# Patient Record
Sex: Female | Born: 1955 | Race: White | Hispanic: No | Marital: Married | Smoking: Never smoker
Health system: Southern US, Community
[De-identification: ages and names within clinical notes are randomized; demographics above are authoritative.]

## PROBLEM LIST (undated history)

## (undated) DIAGNOSIS — E119 Type 2 diabetes mellitus without complications: Secondary | ICD-10-CM

---

## 1994-05-11 HISTORY — PX: ABDOMINAL HYSTERECTOMY: SHX81

## 2017-11-14 ENCOUNTER — Other Ambulatory Visit: Payer: Self-pay

## 2017-11-14 ENCOUNTER — Ambulatory Visit
Admission: EM | Admit: 2017-11-14 | Discharge: 2017-11-14 | Disposition: A | Discharge status: Home / Self Care | Payer: PRIVATE HEALTH INSURANCE | Attending: Family Medicine | Admitting: Family Medicine

## 2017-11-14 DIAGNOSIS — B356 Tinea cruris: Secondary | ICD-10-CM

## 2017-11-14 DIAGNOSIS — B354 Tinea corporis: Secondary | ICD-10-CM

## 2017-11-14 HISTORY — DX: Type 2 diabetes mellitus without complications: E11.9

## 2017-11-14 MED ORDER — FLUCONAZOLE 150 MG PO TABS: ORAL_TABLET | ORAL | 0 refills | Status: AC

## 2017-11-14 NOTE — Discharge Instructions (Signed)
Hold lovastatin while taking antifungal

## 2017-11-14 NOTE — ED Provider Notes (Signed)
[facility]    CSN: [id number] Arrival date & time: 11/14/17  1416     History   Chief Complaint Chief Complaint  Patient presents with  . Rash    HPI Nancy Klein is a 62 y.o. female.   62 yo   The history is provided by the patient.  Rash  Location:  Pelvis Pelvic rash location:  Vulva, R buttock, L buttock and perineum Quality: burning, itchiness, painful, peeling, redness and scaling   Severity:  Moderate Onset quality:  Sudden Duration:  3 days Timing:  Constant Progression:  Worsening Chronicity:  New Context: not animal contact, not chemical exposure, not diapers, not eggs, not exposure to similar rash, not food, not hot tub use, not insect bite/sting, not medications, not new detergent/soap, not nuts, not plant contact, not pollen, not pregnancy, not sick contacts and not sun exposure   Relieved by:  Nothing Ineffective treatments:  Anti-itch cream Associated symptoms: no abdominal pain, no diarrhea, no fatigue, no fever, no headaches, no hoarse voice, no induration, no joint pain, no myalgias, no nausea, no periorbital edema, no shortness of breath, no sore throat, no throat swelling, no tongue swelling, no URI, not vomiting and not wheezing     Past Medical History:  Diagnosis Date  . Diabetes (HCC)     There are no active problems to display for this patient.   Past Surgical History:  Procedure Laterality Date  . ABDOMINAL HYSTERECTOMY  1996    OB History   None      Home Medications    Prior to Admission medications   Medication Sig Start Date End Date Taking? Authorizing Provider  glipiZIDE (GLUCOTROL XL) 5 MG 24 hr tablet Take by mouth. 10/13/17 10/13/18 Yes [provider]  Insulin Glargine (LANTUS SOLOSTAR) 100 UNIT/ML Solostar Pen Inject into the skin. 09/10/17  Yes [provider]  losartan (COZAAR) 25 MG tablet TK 1 T PO ONCE D 10/13/17  Yes [provider]  lovastatin (MEVACOR) 40 MG tablet TK 1 T PO QD  05/24/17  Yes [provider]  metFORMIN (GLUCOPHAGE-XR) 500 MG 24 hr tablet Take by mouth. 09/10/17 09/10/18 Yes [provider]  topiramate (TOPAMAX) 25 MG tablet TK 1 T PO D 08/18/17  Yes [provider]  venlafaxine XR (EFFEXOR-XR) 37.5 MG 24 hr capsule TK ONE C PO ONCE D 09/10/17  Yes [provider]  zolpidem (AMBIEN) 10 MG tablet Take by mouth. 07/27/17  Yes [provider]  fluconazole (DIFLUCAN) 150 MG tablet 1 tab po once weekly 11/14/17   Payton Mccallumonty, Ruchama Kubicek, MD    Family History Family History  Problem Relation Age of Onset  . Diabetes Mother     Social History Social History   Tobacco Use  . Smoking status: Never Smoker  . Smokeless tobacco: Never Used  Substance Use Topics  . Alcohol use: Yes    Comment: rarely  . Drug use: Never     Allergies   Amoxicillin   Review of Systems Review of Systems  Constitutional: Negative for fatigue and fever.  HENT: Negative for hoarse voice and sore throat.   Respiratory: Negative for shortness of breath and wheezing.   Gastrointestinal: Negative for abdominal pain, diarrhea, nausea and vomiting.  Musculoskeletal: Negative for arthralgias and myalgias.  Skin: Positive for rash.  Neurological: Negative for headaches.     Physical Exam Triage Vital Signs ED Triage Vitals  Enc Vitals Group     BP 11/14/17 1432 110/67  Pulse Rate 11/14/17 1432 94     Resp 11/14/17 1432 17     Temp 11/14/17 1432 98.6 F (37 C)     Temp Source 11/14/17 1432 Oral     SpO2 11/14/17 1432 97 %     Weight 11/14/17 1428 280 lb (127 kg)     Height 11/14/17 1428 5' 2.5" (1.588 m)     Head Circumference --      Peak Flow --      Pain Score 11/14/17 1428 9     Pain Loc --      Pain Edu? --      Excl. in GC? --    No data found.  Updated Vital Signs BP 110/67 (BP Location: Right Arm)   Pulse 94   Temp 98.6 F (37 C) (Oral)   Resp 17   Ht 5' 2.5" (1.588 m)   Wt 280 lb (127 kg)   SpO2 97%   BMI  50.40 kg/m   Visual Acuity Right Eye Distance:   Left Eye Distance:   Bilateral Distance:    Right Eye Near:   Left Eye Near:    Bilateral Near:     Physical Exam  Constitutional: She appears well-developed and well-nourished. No distress.  Genitourinary: Pelvic exam was performed with patient supine. There is rash on the right labia. There is rash on the left labia.  Genitourinary Comments: Extensive external vulvar erythematous, scaly, excoriated, skin extending towards the inner thighs and buttocks  Skin: She is not diaphoretic.  Nursing note and vitals reviewed.    UC Treatments / Results  Labs (all labs ordered are listed, but only abnormal results are displayed) Labs Reviewed - No data to display  EKG None  Radiology No results found.  Procedures Procedures (including critical care time)  Medications Ordered in UC Medications - No data to display  Initial Impression / Assessment and Plan / UC Course  I have reviewed the triage vital signs and the nursing notes.  Pertinent labs & imaging results that were available during my care of the patient were reviewed by me and considered in my medical decision making (see chart for details).      Final Clinical Impressions(s) / UC Diagnoses   Final diagnoses:  Tinea cruris  Tinea corporis     Discharge Instructions     Hold lovastatin while taking antifungal    ED Prescriptions    Medication Sig Dispense Auth. Provider   fluconazole (DIFLUCAN) 150 MG tablet 1 tab po once weekly 2 tablet Payton Mccallum, MD     1. diagnosis reviewed with patient 2. rx as per orders above; reviewed possible side effects, interactions, risks and benefits  3. Recommend supportive treatment with otc antifungal powder  4. Follow-up prn if symptoms worsen or don't improve Controlled Substance Prescriptions  Controlled Substance Registry consulted? Not Applicable   Payton Mccallum, MD 11/14/17 [phone]

## 2017-11-14 NOTE — ED Triage Notes (Signed)
Patient complains of rash on vaginal area and is on the back left leg. Patient states that rash is very itchy.

## 2017-12-17 ENCOUNTER — Other Ambulatory Visit: Payer: Self-pay | Admitting: Family Medicine

## 2017-12-17 DIAGNOSIS — Z1231 Encounter for screening mammogram for malignant neoplasm of breast: Secondary | ICD-10-CM

## 2017-12-21 ENCOUNTER — Encounter (INDEPENDENT_AMBULATORY_CARE_PROVIDER_SITE_OTHER): Payer: Self-pay

## 2017-12-21 ENCOUNTER — Ambulatory Visit
Admission: RE | Admit: 2017-12-21 | Discharge: 2017-12-21 | Disposition: A | Discharge status: Home / Self Care | Payer: No Typology Code available for payment source | Source: Ambulatory Visit | Attending: Family Medicine | Admitting: Family Medicine

## 2017-12-21 DIAGNOSIS — Z1231 Encounter for screening mammogram for malignant neoplasm of breast: Secondary | ICD-10-CM | POA: Diagnosis present

## 2017-12-28 ENCOUNTER — Inpatient Hospital Stay
Admission: RE | Admit: 2017-12-28 | Discharge: 2017-12-28 | Disposition: A | Discharge status: Home / Self Care | Payer: Self-pay | Source: Ambulatory Visit | Attending: *Deleted | Admitting: *Deleted

## 2017-12-28 ENCOUNTER — Other Ambulatory Visit: Payer: Self-pay | Admitting: *Deleted

## 2017-12-28 DIAGNOSIS — Z9289 Personal history of other medical treatment: Secondary | ICD-10-CM

## 2018-10-20 IMAGING — MG MM DIGITAL SCREENING BILAT W/ TOMO W/ CAD
8 series · 8 of 24 positions shown · non-contrast
Comparison: Previous exam(s).

ACR Breast Density Category a: The breast tissue is almost entirely
fatty.

CLINICAL DATA: Screening.

EXAM:
DIGITAL SCREENING BILATERAL MAMMOGRAM WITH TOMO AND CAD

[R CC synth-2D]
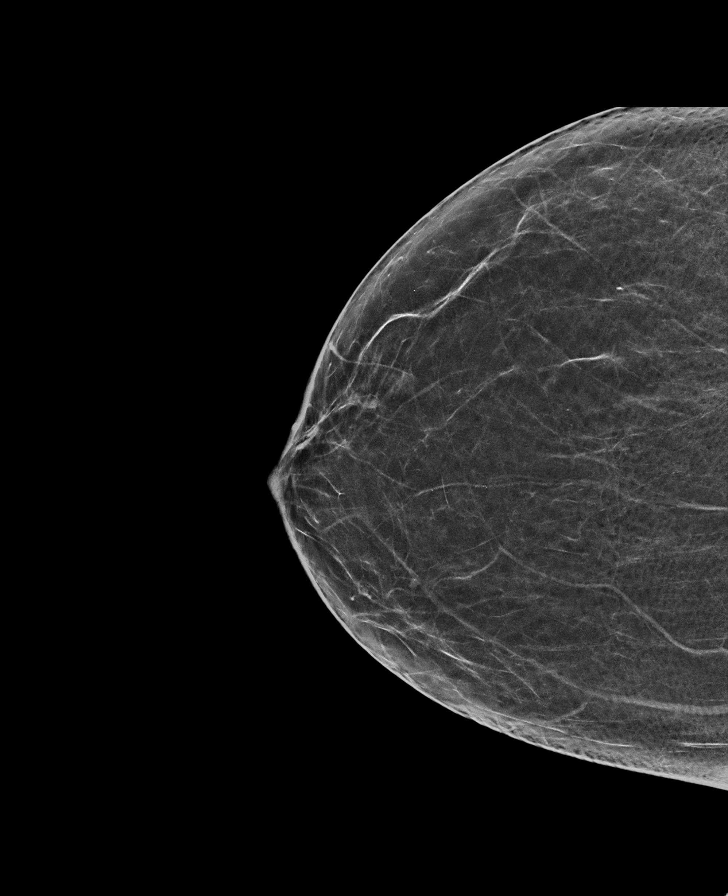

[L MLO synth-2D]
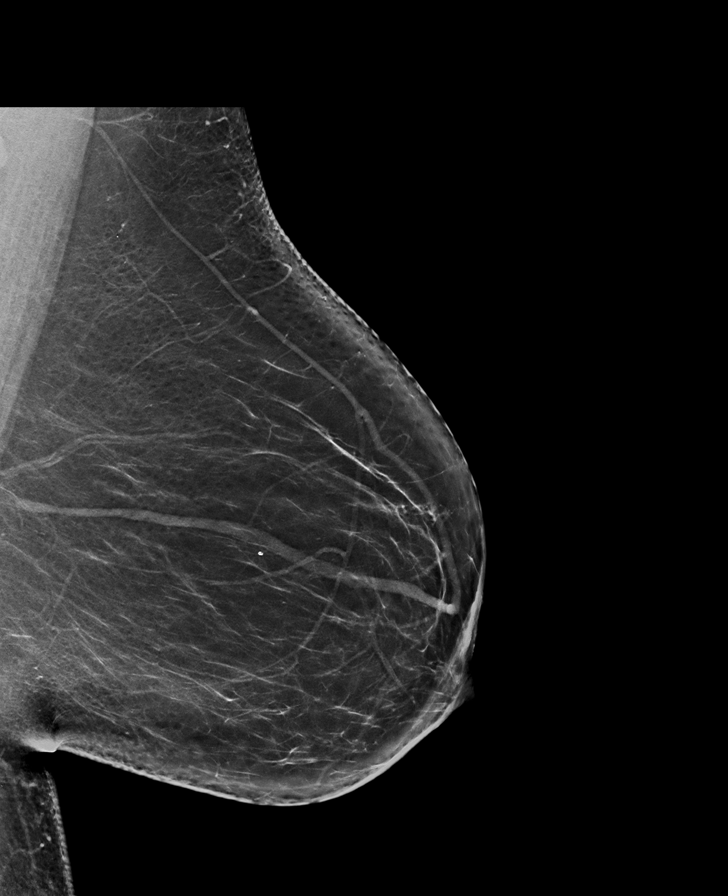

[R MLO synth-2D]
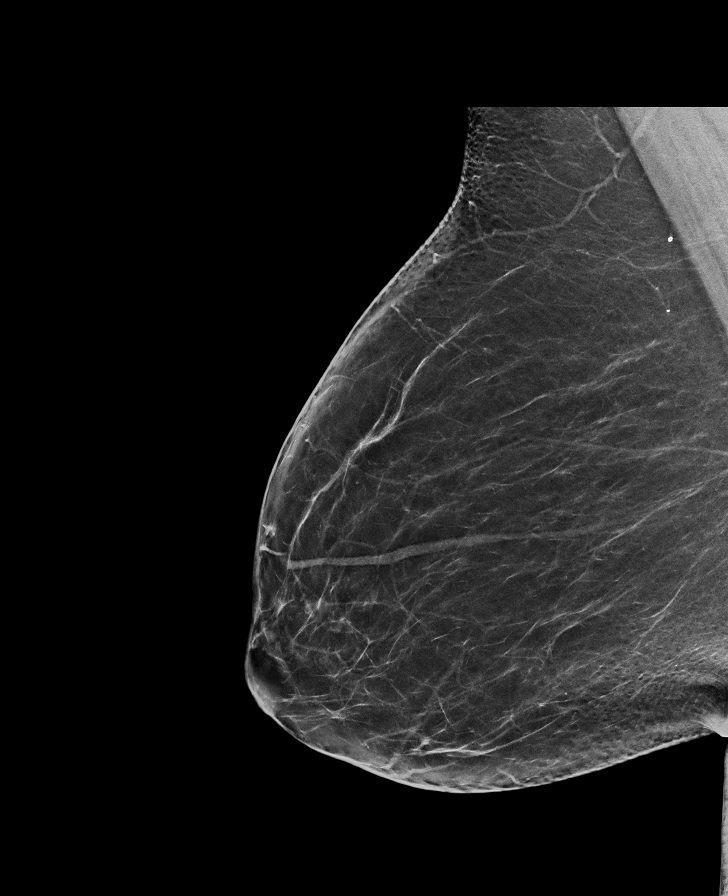

[L CC synth-2D]
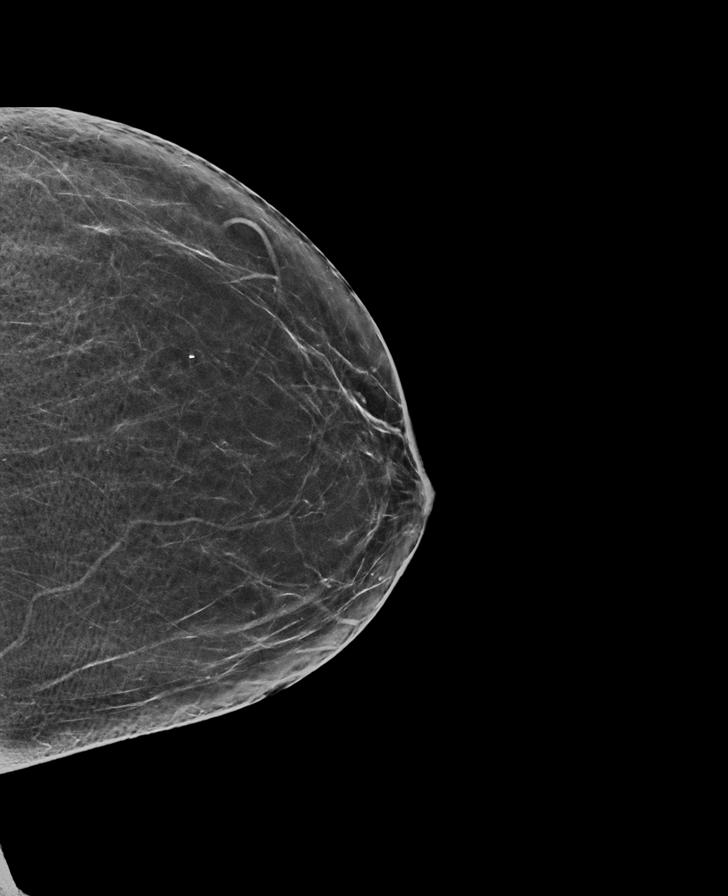

[R MLO tomo · tomo slice 39/76.0]
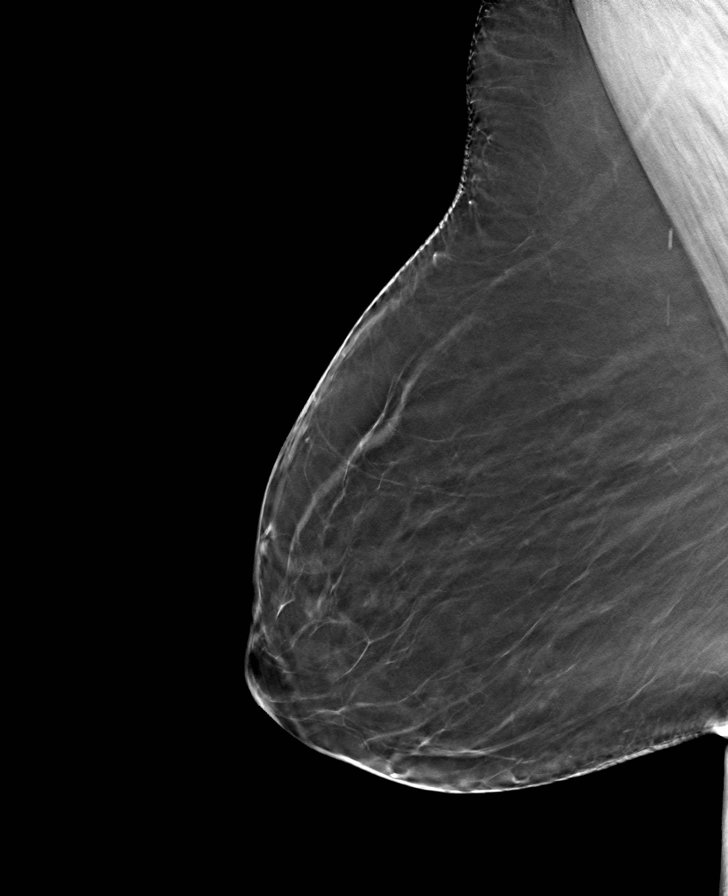

[L MLO tomo · tomo slice 40/79.0]
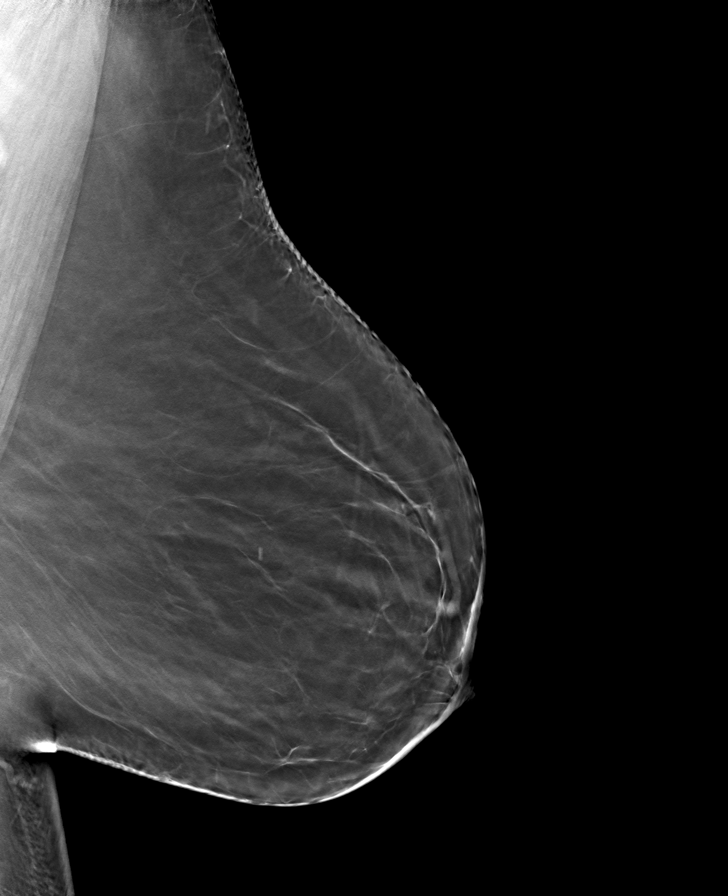

[L CC tomo · tomo slice 33/65.0]
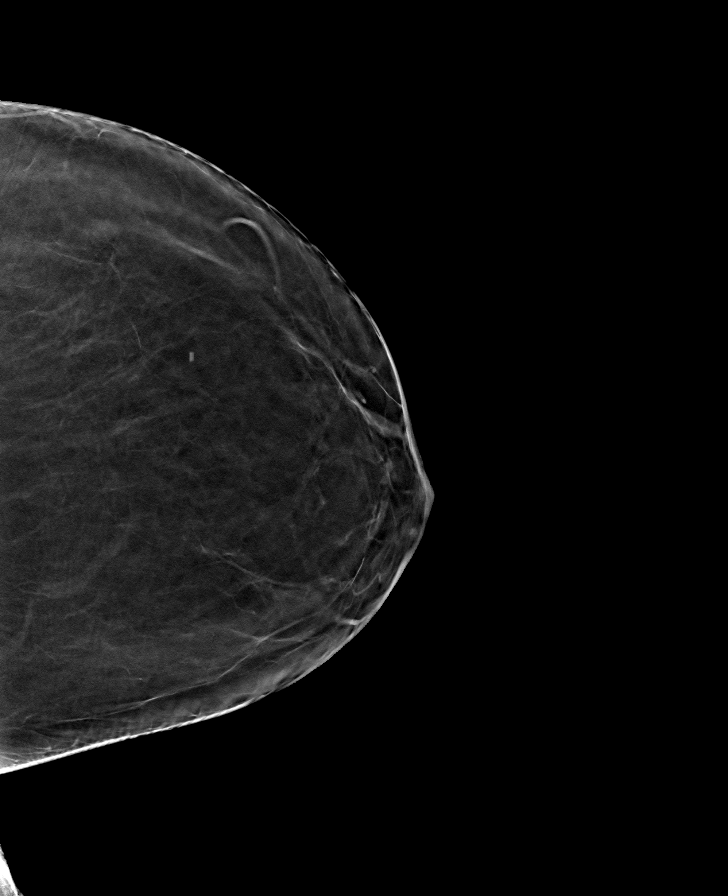

[R CC tomo · tomo slice 31/61.0]
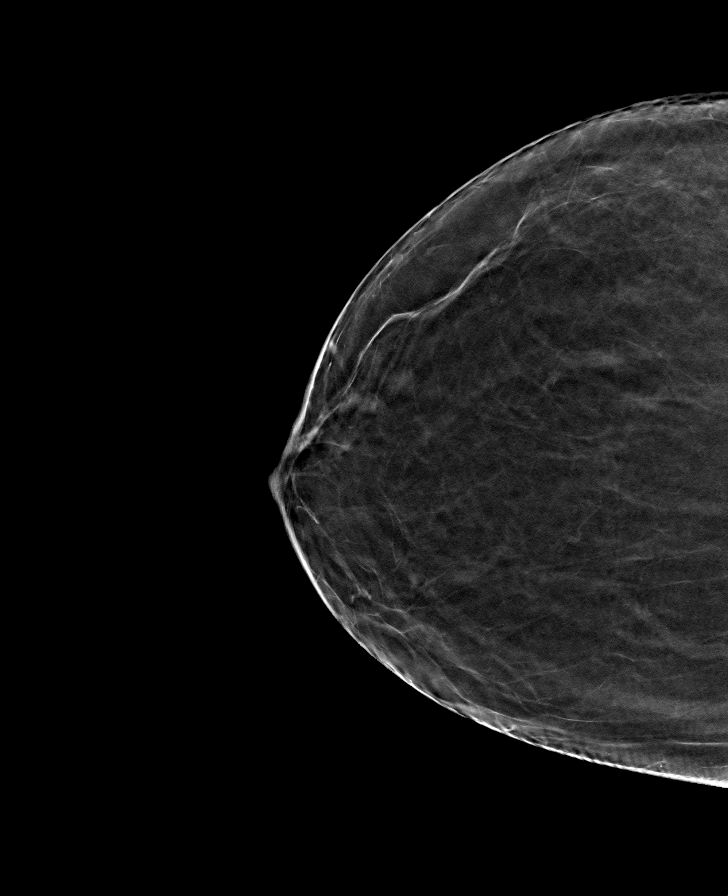

[8 of 24 positions shown; findings below may reference images not displayed]

FINDINGS: There are no findings suspicious for malignancy. Images were
processed with CAD.
IMPRESSION: No mammographic evidence of malignancy. A result letter of this
screening mammogram will be mailed directly to the patient.

RECOMMENDATION:
Screening mammogram in one year. (Code:[id number]-[id number])

BI-RADS CATEGORY  1: Negative.

## 2019-01-17 ENCOUNTER — Other Ambulatory Visit: Payer: Self-pay | Admitting: Family Medicine

## 2019-01-17 DIAGNOSIS — Z1231 Encounter for screening mammogram for malignant neoplasm of breast: Secondary | ICD-10-CM

## 2019-02-02 ENCOUNTER — Ambulatory Visit: Payer: No Typology Code available for payment source

## 2019-03-02 ENCOUNTER — Ambulatory Visit
Admission: RE | Admit: 2019-03-02 | Discharge: 2019-03-02 | Disposition: A | Discharge status: Home / Self Care | Payer: No Typology Code available for payment source | Source: Ambulatory Visit | Attending: Family Medicine | Admitting: Family Medicine

## 2019-03-02 ENCOUNTER — Other Ambulatory Visit: Payer: Self-pay

## 2019-03-02 DIAGNOSIS — Z1231 Encounter for screening mammogram for malignant neoplasm of breast: Secondary | ICD-10-CM | POA: Diagnosis not present
